# Patient Record
Sex: Female | Born: 1940 | Race: White | Hispanic: No | Marital: Married | Smoking: Never smoker
Health system: Southern US, Community
[De-identification: ages and names within clinical notes are randomized; demographics above are authoritative.]

## PROBLEM LIST (undated history)

## (undated) DIAGNOSIS — N390 Urinary tract infection, site not specified: Secondary | ICD-10-CM

## (undated) DIAGNOSIS — K219 Gastro-esophageal reflux disease without esophagitis: Secondary | ICD-10-CM

## (undated) DIAGNOSIS — R0600 Dyspnea, unspecified: Secondary | ICD-10-CM

## (undated) DIAGNOSIS — N189 Chronic kidney disease, unspecified: Secondary | ICD-10-CM

## (undated) DIAGNOSIS — G4730 Sleep apnea, unspecified: Secondary | ICD-10-CM

## (undated) DIAGNOSIS — E7800 Pure hypercholesterolemia, unspecified: Secondary | ICD-10-CM

## (undated) DIAGNOSIS — J449 Chronic obstructive pulmonary disease, unspecified: Secondary | ICD-10-CM

## (undated) DIAGNOSIS — E039 Hypothyroidism, unspecified: Secondary | ICD-10-CM

## (undated) HISTORY — PX: EYE SURGERY: SHX253

## (undated) HISTORY — PX: HERNIA REPAIR: SHX51

## (undated) HISTORY — PX: OTHER SURGICAL HISTORY: SHX169

## (undated) HISTORY — PX: COLONOSCOPY: SHX174

## (undated) HISTORY — PX: SHOULDER SURGERY: SHX246

## (undated) HISTORY — PX: BLADDER SUSPENSION: SHX72

## (undated) HISTORY — PX: HEMORROIDECTOMY: SUR656

## (undated) HISTORY — PX: ABDOMINAL HYSTERECTOMY: SHX81

---

## 2005-03-23 ENCOUNTER — Ambulatory Visit: Payer: Self-pay

## 2006-03-27 ENCOUNTER — Ambulatory Visit: Payer: Self-pay | Admitting: Nurse Practitioner

## 2007-03-21 ENCOUNTER — Ambulatory Visit: Payer: Self-pay | Admitting: Ophthalmology

## 2007-05-31 ENCOUNTER — Ambulatory Visit: Payer: Self-pay | Admitting: Gastroenterology

## 2007-06-13 ENCOUNTER — Ambulatory Visit: Payer: Self-pay | Admitting: Nurse Practitioner

## 2008-06-16 ENCOUNTER — Ambulatory Visit: Payer: Self-pay | Admitting: Nurse Practitioner

## 2008-08-05 ENCOUNTER — Ambulatory Visit: Payer: Self-pay

## 2008-09-09 ENCOUNTER — Ambulatory Visit: Payer: Self-pay | Admitting: Orthopedic Surgery

## 2008-09-18 ENCOUNTER — Ambulatory Visit: Payer: Self-pay | Admitting: Orthopedic Surgery

## 2008-10-29 ENCOUNTER — Encounter: Payer: Self-pay | Admitting: Specialist

## 2008-11-13 ENCOUNTER — Encounter: Payer: Self-pay | Admitting: Specialist

## 2010-12-08 ENCOUNTER — Ambulatory Visit: Payer: Self-pay | Admitting: Nurse Practitioner

## 2012-01-03 ENCOUNTER — Ambulatory Visit: Payer: Self-pay | Admitting: Nurse Practitioner

## 2012-07-25 ENCOUNTER — Ambulatory Visit: Payer: Self-pay | Admitting: Gastroenterology

## 2013-06-05 ENCOUNTER — Ambulatory Visit: Payer: Self-pay

## 2013-06-14 ENCOUNTER — Ambulatory Visit: Payer: Self-pay | Admitting: Ophthalmology

## 2013-06-24 ENCOUNTER — Ambulatory Visit: Payer: Self-pay | Admitting: Ophthalmology

## 2014-06-06 ENCOUNTER — Ambulatory Visit: Payer: Self-pay | Admitting: Physician Assistant

## 2014-12-05 NOTE — Op Note (Signed)
PATIENT NAME:  Rebecca Snow, DATE MR#:  [id number] DATE OF BIRTH:  May 10, 1941  DATE OF PROCEDURE:  06/24/2013  PREOPERATIVE DIAGNOSIS: Cataract, left eye.   POSTOPERATIVE DIAGNOSIS: Cataract, left eye.   PROCEDURE PERFORMED: Extracapsular cataract extraction using phacoemulsification with placement of Alcon [id number], 25.5-diopter posterior chamber lens, serial number [id number].   SURGEON: Loura Back. Nakya Weyand, M.D.   ANESTHESIA: 4% lidocaine and 0.75% Marcaine a 50-50 mixture with 10 units/mL of HyoMax added, given as a peribulbar.   ANESTHESIOLOGIST: Dr. Myra Gianotti  COMPLICATIONS: None.   ESTIMATED BLOOD LOSS: Less than 1 mL.   DESCRIPTION OF PROCEDURE: The patient was brought to the operating room and given a peribulbar block.  The patient was then prepped and draped in the usual fashion.  The vertical rectus muscles were imbricated using 5-0 silk sutures.  These sutures were then clamped to the sterile drapes as bridle sutures.  A limbal peritomy was performed extending two clock hours and hemostasis was obtained with cautery.  A partial thickness scleral groove was made at the surgical limbus and dissected anteriorly in a lamellar dissection using an Alcon crescent knife.  The anterior chamber was entered supero-temporally with a Superblade and through the lamellar dissection with a 2.6 mm keratome.  DisCoVisc was used to replace the aqueous and a continuous tear capsulorrhexis was carried out.  Hydrodissection and hydrodelineation were carried out with balanced salt and a 27 gauge canula.  The nucleus was rotated to confirm the effectiveness of the hydrodissection.  Phacoemulsification was carried out using a divide-and-conquer technique.  Total ultrasound time was 1 minute and 15.3 seconds with an average power of  28.8%. CDE 28.28.  Irrigation/aspiration was used to remove the residual cortex.  DisCoVisc was used to inflate the capsule and the internal incision was enlarged to 3 mm with the  crescent knife.  The intraocular lens was folded and inserted into the capsular bag using the AcrySert delivery system.  Irrigation/aspiration was used to remove the residual DisCoVisc.  Miostat was injected into the anterior chamber through the paracentesis track to inflate the anterior chamber and induce miosis.  A tenth of a mL of cefuroxime containing 1 mg of drug was injected through the paracentesis track. The wound was checked for leaks and wound leakage was found.  A single 10-0 suture was placed across the incision, tied and the knot was rotated superiorly.  The conjunctiva was closed with cautery and the bridle sutures were removed.  Two drops of 0.3% Vigamox were placed on the eye.   An eye shield was placed on the eye.  The patient was discharged to the recovery room in good condition.  ____________________________ Loura Back Mily Malecki, MD sad:aw D: 06/24/2013 13:01:14 ET T: 06/24/2013 13:35:16 ET JOB#: [id number]  cc: Remo Lipps A. Chun Sellen, MD, <Dictator> Martie Lee MD ELECTRONICALLY SIGNED 07/01/2013 13:51

## 2015-09-14 ENCOUNTER — Other Ambulatory Visit: Payer: Self-pay | Admitting: Physician Assistant

## 2015-09-14 DIAGNOSIS — Z1231 Encounter for screening mammogram for malignant neoplasm of breast: Secondary | ICD-10-CM

## 2015-09-21 ENCOUNTER — Ambulatory Visit: Payer: Self-pay | Attending: Physician Assistant

## 2015-10-13 ENCOUNTER — Other Ambulatory Visit: Payer: Self-pay | Admitting: Physician Assistant

## 2015-10-13 ENCOUNTER — Ambulatory Visit
Admission: RE | Admit: 2015-10-13 | Discharge: 2015-10-13 | Disposition: A | Discharge status: Home / Self Care | Payer: Medicare HMO | Source: Ambulatory Visit | Attending: Physician Assistant | Admitting: Physician Assistant

## 2015-10-13 DIAGNOSIS — Z1231 Encounter for screening mammogram for malignant neoplasm of breast: Secondary | ICD-10-CM | POA: Insufficient documentation

## 2016-12-15 ENCOUNTER — Other Ambulatory Visit: Payer: Self-pay | Admitting: Physician Assistant

## 2016-12-15 DIAGNOSIS — Z1231 Encounter for screening mammogram for malignant neoplasm of breast: Secondary | ICD-10-CM

## 2017-01-03 ENCOUNTER — Ambulatory Visit
Admission: RE | Admit: 2017-01-03 | Discharge: 2017-01-03 | Disposition: A | Discharge status: Home / Self Care | Payer: Medicare HMO | Source: Ambulatory Visit | Attending: Physician Assistant | Admitting: Physician Assistant

## 2017-01-03 DIAGNOSIS — Z1231 Encounter for screening mammogram for malignant neoplasm of breast: Secondary | ICD-10-CM | POA: Diagnosis present

## 2017-06-09 ENCOUNTER — Encounter: Payer: Self-pay | Admitting: *Deleted

## 2017-06-12 ENCOUNTER — Encounter
Admission: RE | Disposition: A | Discharge status: Home / Self Care | Payer: Self-pay | Source: Ambulatory Visit | Attending: Gastroenterology

## 2017-06-12 ENCOUNTER — Ambulatory Visit: Payer: Medicare HMO | Admitting: Anesthesiology

## 2017-06-12 ENCOUNTER — Ambulatory Visit
Admission: RE | Admit: 2017-06-12 | Discharge: 2017-06-12 | Disposition: A | Discharge status: Home / Self Care | Payer: Medicare HMO | Source: Ambulatory Visit | Attending: Gastroenterology | Admitting: Gastroenterology

## 2017-06-12 ENCOUNTER — Encounter: Payer: Self-pay | Admitting: *Deleted

## 2017-06-12 DIAGNOSIS — E7800 Pure hypercholesterolemia, unspecified: Secondary | ICD-10-CM | POA: Diagnosis not present

## 2017-06-12 DIAGNOSIS — K5730 Diverticulosis of large intestine without perforation or abscess without bleeding: Secondary | ICD-10-CM | POA: Insufficient documentation

## 2017-06-12 DIAGNOSIS — Z800 Family history of malignant neoplasm of digestive organs: Secondary | ICD-10-CM | POA: Diagnosis present

## 2017-06-12 DIAGNOSIS — Z8371 Family history of colonic polyps: Secondary | ICD-10-CM | POA: Insufficient documentation

## 2017-06-12 DIAGNOSIS — G4730 Sleep apnea, unspecified: Secondary | ICD-10-CM | POA: Insufficient documentation

## 2017-06-12 DIAGNOSIS — Z79899 Other long term (current) drug therapy: Secondary | ICD-10-CM | POA: Insufficient documentation

## 2017-06-12 DIAGNOSIS — J449 Chronic obstructive pulmonary disease, unspecified: Secondary | ICD-10-CM | POA: Insufficient documentation

## 2017-06-12 DIAGNOSIS — Z7982 Long term (current) use of aspirin: Secondary | ICD-10-CM | POA: Diagnosis not present

## 2017-06-12 DIAGNOSIS — E039 Hypothyroidism, unspecified: Secondary | ICD-10-CM | POA: Insufficient documentation

## 2017-06-12 DIAGNOSIS — K219 Gastro-esophageal reflux disease without esophagitis: Secondary | ICD-10-CM | POA: Insufficient documentation

## 2017-06-12 HISTORY — DX: Gastro-esophageal reflux disease without esophagitis: K21.9

## 2017-06-12 HISTORY — DX: Hypothyroidism, unspecified: E03.9

## 2017-06-12 HISTORY — DX: Chronic obstructive pulmonary disease, unspecified: J44.9

## 2017-06-12 HISTORY — PX: COLONOSCOPY WITH PROPOFOL: SHX5780

## 2017-06-12 HISTORY — DX: Dyspnea, unspecified: R06.00

## 2017-06-12 HISTORY — DX: Sleep apnea, unspecified: G47.30

## 2017-06-12 HISTORY — DX: Pure hypercholesterolemia, unspecified: E78.00

## 2017-06-12 SURGERY — COLONOSCOPY WITH PROPOFOL
Anesthesia: General

## 2017-06-12 MED ORDER — PROPOFOL 10 MG/ML IV BOLUS
INTRAVENOUS | Status: DC | PRN
  Administered 2017-06-12: 80 mg via INTRAVENOUS

## 2017-06-12 MED ORDER — PROPOFOL 500 MG/50ML IV EMUL
INTRAVENOUS | Status: DC | PRN
  Administered 2017-06-12: 100 ug/kg/min via INTRAVENOUS

## 2017-06-12 MED ORDER — SODIUM CHLORIDE 0.9 % IV SOLN: INTRAVENOUS | Status: DC

## 2017-06-12 MED ORDER — PROPOFOL 10 MG/ML IV BOLUS
INTRAVENOUS | Status: AC
  Filled 2017-06-12: qty 20

## 2017-06-12 MED ORDER — SODIUM CHLORIDE 0.9 % IV SOLN
INTRAVENOUS | Status: DC
  Administered 2017-06-12: 1000 mL via INTRAVENOUS

## 2017-06-12 NOTE — Transfer of Care (Signed)
Immediate Anesthesia Transfer of Care Note  Patient: Rebecca Snow  Procedure(s) Performed: COLONOSCOPY WITH PROPOFOL (N/A )  Patient Location: PACU and Endoscopy Unit  Anesthesia Type:General  Level of Consciousness: drowsy and patient cooperative  Airway & Oxygen Therapy: Patient Spontanous Breathing and Patient connected to nasal cannula oxygen  Post-op Assessment: Report given to RN and Post -op Vital signs reviewed and stable  Post vital signs: Reviewed and stable  Last Vitals:  Vitals:   06/12/17 1335 06/12/17 1520  BP: (!) 141/91 (!) 141/91  Pulse: 71 68  Resp: 18 18  Temp: (!) 36.2 C (!) 35.9 C  SpO2: 98% 98%    Last Pain:  Vitals:   06/12/17 1520  TempSrc: Tympanic      Patients Stated Pain Goal: 0 (16/24/46 9507)  Complications: No apparent anesthesia complications

## 2017-06-12 NOTE — Op Note (Signed)
[facility] Gastroenterology Patient Name: Rebecca Snow Procedure Date: 06/12/2017 3:03 PM MRN: [id number] Account #: [id number] Date of Birth: 05/03/1941 Admit Type: Outpatient Age: 76 Room: [facility] ENDO ROOM 3 Gender: Female Note Status: Finalized Procedure:            Colonoscopy Indications:          Family history of colon cancer in a first-degree                        relative, Family history of colonic polyps in a                        first-degree relative Providers:            [name], MD Referring MD:         Joyice Faster (Referring MD) Medicines:            Monitored Anesthesia Care Complications:        No immediate complications. Procedure:            Pre-Anesthesia Assessment:                       - ASA Grade Assessment: II - A patient with mild                        systemic disease.                       After obtaining informed consent, the colonoscope was                        passed under direct vision. Throughout the procedure,                        the patient's blood pressure, pulse, and oxygen                        saturations were monitored continuously. The                        Colonoscope was introduced through the anus with the                        intention of advancing to the cecum. The scope was                        advanced to the sigmoid colon before the procedure was                        aborted. Medications were given. The colonoscopy was                        extremely difficult due to poor bowel prep with stool                        present. Findings:      Multiple medium-mouthed diverticula were found in the distal sigmoid       colon.      A large amount of semi-solid solid stool was found in the sigmoid colon,       precluding visualization.  The digital rectal exam was normal. Impression:           - Diverticulosis in the distal sigmoid colon.                       - Stool in the  sigmoid colon.                       - No specimens collected. Recommendation:       - Discharge patient to home.                       - reschedule and reprep. Procedure Code(s):    --- Professional ---                       [phone], 26, Colonoscopy, flexible; diagnostic, including                        collection of specimen(s) by brushing or washing, when                        performed (separate procedure) Diagnosis Code(s):    --- Professional ---                       Z80.0, Family history of malignant neoplasm of                        digestive organs                       Z83.71, Family history of colonic polyps                       K57.30, Diverticulosis of large intestine without                        perforation or abscess without bleeding CPT copyright 2016 American [employer] Association. All rights reserved. The codes documented in this report are preliminary and upon coder review may  be revised to meet current compliance requirements. [name], MD 06/12/2017 3:24:52 PM This report has been signed electronically. Number of Addenda: 0 Note Initiated On: 06/12/2017 3:03 PM Total Procedure Duration: 0 hours 2 minutes 26 seconds       [facility]

## 2017-06-12 NOTE — H&P (Signed)
Outpatient short stay form Pre-procedure 06/12/2017 2:59 PM Rebecca Sails MD  Primary Physician: Neysa Hotter, PA  Reason for visit:  Colonoscopy  History of present illness:  Patient is a 76 year old female presenting today as above. States she has not had polyps in the past however she has multiple primary relatives that do have colon polyps. There is also family history of colon cancer in her father. He is presenting today for further evaluation. She tolerated her prep however did state that it did make her sick to her stomach. She did get the whole prep down. She is not having a solid stool today.. She does take a daily aspirin but has held that since last week. She takes no blood thinning agents otherwise.    Current Facility-Administered Medications:  .  0.9 %  sodium chloride infusion, , Intravenous, Continuous, Rebecca Sails, MD, Last Rate: 20 mL/hr at 06/12/17 1353, 1,000 mL at 06/12/17 1353 .  0.9 %  sodium chloride infusion, , Intravenous, Continuous, Rebecca Sails, MD  Prescriptions Prior to Admission  Medication Sig Dispense Refill Last Dose  . alendronate (FOSAMAX) 70 MG tablet Take 70 mg by mouth once a week. Take with a full glass of water on an empty stomach.   Past Week at Unknown time  . aspirin EC 81 MG tablet Take 81 mg by mouth daily.   Past Week at Unknown time  . Calcium-Vitamin D-Vitamin K (VIACTIV PO) Take 1 tablet by mouth daily.   Past Week at Unknown time  . cetirizine (ZYRTEC) 10 MG tablet Take 10 mg by mouth daily.   Past Week at Unknown time  . clotrimazole (GYNE-LOTRIMIN) 1 % vaginal cream Place 1 Applicatorful vaginally as needed.   Past Week at Unknown time  . Garlic 8099 MG CAPS Take 1 capsule by mouth daily.   Past Week at Unknown time  . levothyroxine (SYNTHROID, LEVOTHROID) 25 MCG tablet Take 25 mcg by mouth daily before breakfast.   Past Week at Unknown time  . Lysine 600 MG TABS Take 1 tablet by mouth daily.   Past Week at Unknown time   . omega-3 acid ethyl esters (LOVAZA) 1 g capsule Take 1 capsule by mouth daily.   Past Week at Unknown time  . omeprazole (PRILOSEC) 20 MG capsule Take 20 mg by mouth daily.   Past Week at Unknown time  . simvastatin (ZOCOR) 40 MG tablet Take 40 mg by mouth daily.   Past Week at Unknown time     Allergies  Allergen Reactions  . Ibuprofen   . Nyquil Multi-Symptom [Pseudoeph-Doxylamine-Dm-Apap]      Past Medical History:  Diagnosis Date  . COPD (chronic obstructive pulmonary disease) ([place])   . Dyspnea   . GERD (gastroesophageal reflux disease)   . High cholesterol   . Hypothyroidism   . Sleep apnea     Review of systems:      Physical Exam    Heart and lungs: Regular rate and rhythm without rub or gallop, lungs are bilaterally clear.    HEENT: Normocephalic atraumatic eyes are anicteric    Other:     Pertinant exam for procedure: Soft nontender nondistended bowel sounds positive normoactive.    Planned proceedures: Colonoscopy and indicated procedures. I have discussed the risks benefits and complications of procedures to include not limited to bleeding, infection, perforation and the risk of sedation and the patient wishes to proceed.    Rebecca Sails, MD Gastroenterology 06/12/2017  2:59 PM

## 2017-06-12 NOTE — Anesthesia Preprocedure Evaluation (Signed)
Anesthesia Evaluation  Patient identified by MRN, date of birth, ID band Patient awake    Reviewed: Allergy & Precautions, NPO status , Patient's Chart, lab work & pertinent test results  Airway Mallampati: II       Dental  (+) Upper Dentures, Lower Dentures   Pulmonary shortness of breath, sleep apnea ,    breath sounds clear to auscultation       Cardiovascular Exercise Tolerance: Good  Rhythm:Regular     Neuro/Psych negative neurological ROS  negative psych ROS   GI/Hepatic Neg liver ROS, GERD  ,  Endo/Other  Hypothyroidism   Renal/GU negative Renal ROS     Musculoskeletal   Abdominal Normal abdominal exam  (+)   Peds negative pediatric ROS (+)  Hematology negative hematology ROS (+)   Anesthesia Other Findings   Reproductive/Obstetrics                             Anesthesia Physical Anesthesia Plan  ASA: II  Anesthesia Plan: General   Post-op Pain Management:    Induction: Intravenous  PONV Risk Score and Plan: 0  Airway Management Planned: Natural Airway and Nasal Cannula  Additional Equipment:   Intra-op Plan:   Post-operative Plan:   Informed Consent: I have reviewed the patients History and Physical, chart, labs and discussed the procedure including the risks, benefits and alternatives for the proposed anesthesia with the patient or authorized representative who has indicated his/her understanding and acceptance.     Plan Discussed with: CRNA  Anesthesia Plan Comments:         Anesthesia Quick Evaluation

## 2017-06-12 NOTE — Anesthesia Post-op Follow-up Note (Signed)
Anesthesia QCDR form completed.        

## 2017-06-12 NOTE — OR Nursing (Signed)
Procedure aborted. Patient not cleared out. Very poor prep, solid stool observed.

## 2017-06-13 NOTE — Anesthesia Postprocedure Evaluation (Signed)
Anesthesia Post Note  Patient: Rebecca Snow  Procedure(s) Performed: COLONOSCOPY WITH PROPOFOL (N/A )  Patient location during evaluation: PACU Anesthesia Type: General Level of consciousness: awake Pain management: pain level controlled Vital Signs Assessment: post-procedure vital signs reviewed and stable Respiratory status: spontaneous breathing Cardiovascular status: stable Anesthetic complications: no     Last Vitals:  Vitals:   06/12/17 1520 06/12/17 1540  BP: (!) 141/91   Pulse: 68   Resp: 18 (!) 120  Temp: (!) 35.9 C   SpO2: 98%     Last Pain:  Vitals:   06/12/17 1520  TempSrc: Tympanic                 VAN Snow,Rebecca Creps

## 2017-06-14 ENCOUNTER — Encounter: Payer: Self-pay | Admitting: Gastroenterology

## 2017-07-31 ENCOUNTER — Ambulatory Visit
Admission: RE | Admit: 2017-07-31 | Discharge: 2017-07-31 | Disposition: A | Discharge status: Home / Self Care | Payer: Medicare HMO | Source: Ambulatory Visit | Attending: Gastroenterology | Admitting: Gastroenterology

## 2017-07-31 ENCOUNTER — Ambulatory Visit: Payer: Medicare HMO | Admitting: Anesthesiology

## 2017-07-31 ENCOUNTER — Encounter
Admission: RE | Disposition: A | Discharge status: Home / Self Care | Payer: Self-pay | Source: Ambulatory Visit | Attending: Gastroenterology

## 2017-07-31 DIAGNOSIS — Z79899 Other long term (current) drug therapy: Secondary | ICD-10-CM | POA: Insufficient documentation

## 2017-07-31 DIAGNOSIS — E7800 Pure hypercholesterolemia, unspecified: Secondary | ICD-10-CM | POA: Insufficient documentation

## 2017-07-31 DIAGNOSIS — K5730 Diverticulosis of large intestine without perforation or abscess without bleeding: Secondary | ICD-10-CM | POA: Insufficient documentation

## 2017-07-31 DIAGNOSIS — N189 Chronic kidney disease, unspecified: Secondary | ICD-10-CM | POA: Insufficient documentation

## 2017-07-31 DIAGNOSIS — Z888 Allergy status to other drugs, medicaments and biological substances status: Secondary | ICD-10-CM | POA: Diagnosis not present

## 2017-07-31 DIAGNOSIS — K635 Polyp of colon: Secondary | ICD-10-CM | POA: Diagnosis not present

## 2017-07-31 DIAGNOSIS — Z8371 Family history of colonic polyps: Secondary | ICD-10-CM | POA: Diagnosis present

## 2017-07-31 DIAGNOSIS — E039 Hypothyroidism, unspecified: Secondary | ICD-10-CM | POA: Diagnosis not present

## 2017-07-31 DIAGNOSIS — J449 Chronic obstructive pulmonary disease, unspecified: Secondary | ICD-10-CM | POA: Diagnosis not present

## 2017-07-31 DIAGNOSIS — D124 Benign neoplasm of descending colon: Secondary | ICD-10-CM | POA: Diagnosis not present

## 2017-07-31 DIAGNOSIS — Z7982 Long term (current) use of aspirin: Secondary | ICD-10-CM | POA: Insufficient documentation

## 2017-07-31 DIAGNOSIS — G4730 Sleep apnea, unspecified: Secondary | ICD-10-CM | POA: Insufficient documentation

## 2017-07-31 HISTORY — PX: COLONOSCOPY WITH PROPOFOL: SHX5780

## 2017-07-31 HISTORY — DX: Chronic kidney disease, unspecified: N18.9

## 2017-07-31 HISTORY — DX: Urinary tract infection, site not specified: N39.0

## 2017-07-31 SURGERY — COLONOSCOPY WITH PROPOFOL
Anesthesia: General

## 2017-07-31 MED ORDER — PROPOFOL 10 MG/ML IV BOLUS
INTRAVENOUS | Status: DC | PRN
  Administered 2017-07-31: 40 mg via INTRAVENOUS

## 2017-07-31 MED ORDER — GLYCOPYRROLATE 0.2 MG/ML IJ SOLN
INTRAMUSCULAR | Status: DC | PRN
  Administered 2017-07-31: 0.2 mg via INTRAVENOUS

## 2017-07-31 MED ORDER — SODIUM CHLORIDE 0.9 % IV SOLN: INTRAVENOUS | Status: DC

## 2017-07-31 MED ORDER — PHENYLEPHRINE HCL 10 MG/ML IJ SOLN
INTRAMUSCULAR | Status: DC | PRN
  Administered 2017-07-31 (×2): 100 ug via INTRAVENOUS

## 2017-07-31 MED ORDER — PROPOFOL 500 MG/50ML IV EMUL
INTRAVENOUS | Status: DC | PRN
  Administered 2017-07-31: 150 ug/kg/min via INTRAVENOUS

## 2017-07-31 MED ORDER — SODIUM CHLORIDE 0.9 % IV SOLN
INTRAVENOUS | Status: DC
  Administered 2017-07-31: 10:00:00 via INTRAVENOUS

## 2017-07-31 MED ORDER — GLYCOPYRROLATE 0.2 MG/ML IJ SOLN
INTRAMUSCULAR | Status: AC
  Filled 2017-07-31: qty 1

## 2017-07-31 NOTE — Op Note (Signed)
[facility] Gastroenterology Patient Name: Rebecca Snow Procedure Date: 07/31/2017 10:17 AM MRN: [id number] Account #: [id number] Date of Birth: 1941-01-02 Admit Type: Outpatient Age: 76 Room: [facility] ENDO ROOM 1 Gender: Female Note Status: Finalized Procedure:            Colonoscopy Indications:          Family history of colonic polyps in a first-degree                        relative Providers:            [name], MD Referring MD:         Carole Civil MD, MD (Referring MD) Medicines:            Monitored Anesthesia Care Complications:        No immediate complications. Procedure:            Pre-Anesthesia Assessment:                       - ASA Grade Assessment: III - A patient with severe                        systemic disease.                       After obtaining informed consent, the colonoscope was                        passed under direct vision. Throughout the procedure,                        the patient's blood pressure, pulse, and oxygen                        saturations were monitored continuously. The                        Colonoscope was introduced through the anus and                        advanced to the the cecum, identified by appendiceal                        orifice and ileocecal valve. The colonoscopy was                        unusually difficult due to multiple diverticula in the                        colon, significant looping and a tortuous colon.                        Successful completion of the procedure was aided by                        changing the patient to a supine position, changing the                        patient to a prone position and using manual pressure.  The patient tolerated the procedure well. The quality                        of the bowel preparation was good. Findings:      Two sessile polyps were found in the descending colon. The polyps were 3       to 4 mm in  size. These polyps were removed with a cold snare. Resection       and retrieval were complete.      A 2 mm polyp was found in the descending colon. The polyp was sessile.       The polyp was removed with a cold biopsy forceps. Resection and       retrieval were complete.      A 2 mm polyp was found in the descending colon. The polyp was sessile.       The polyp was removed with a cold biopsy forceps. Resection and       retrieval were complete.      Many small and large-mouthed diverticula were found in the sigmoid       colon, descending colon and transverse colon.      The digital rectal exam was normal. Impression:           - Two 3 to 4 mm polyps in the descending colon, removed                        with a cold snare. Resected and retrieved.                       - One 2 mm polyp in the descending colon, removed with                        a cold biopsy forceps. Resected and retrieved.                       - One 2 mm polyp in the descending colon, removed with                        a cold biopsy forceps. Resected and retrieved.                       - Diverticulosis in the sigmoid colon, in the                        descending colon and in the transverse colon. Recommendation:       - Discharge patient to home.                       - Soft diet for 1 day, then advance as tolerated to                        advance diet as tolerated. Procedure Code(s):    --- Professional ---                       [phone], Colonoscopy, flexible; with removal of tumor(s),                        polyp(s), or other lesion(s) by snare technique  [id number], 39, Colonoscopy, flexible; with biopsy, single                        or multiple Diagnosis Code(s):    --- Professional ---                       D12.4, Benign neoplasm of descending colon                       Z83.71, Family history of colonic polyps                       K57.30, Diverticulosis of large intestine without                         perforation or abscess without bleeding CPT copyright 2016 American Medical Association. All rights reserved. The codes documented in this report are preliminary and upon coder review may  be revised to meet current compliance requirements. [name], MD 07/31/2017 11:26:17 AM This report has been signed electronically. Number of Addenda: 0 Note Initiated On: 07/31/2017 10:17 AM Scope Withdrawal Time: 0 hours 11 minutes 14 seconds  Total Procedure Duration: 0 hours 41 minutes 19 seconds       [facility]

## 2017-07-31 NOTE — H&P (Signed)
Outpatient short stay form Pre-procedure 07/31/2017 10:35 AM [name] MD  Primary Physician: Neysa Hotter, PA  Reason for visit:  Colonoscopy  History of present illness:  Patient is a 76 year old female presenting today as above. There is family history of colon polyps and primary relatives. Her last colonoscopy was about 5 years ago. There is an attempt month or so ago that was poor due to prep. She tolerated her prep last night better. She takes no aspirin or blood thinning agent.    Current Facility-Administered Medications:  .  0.9 %  sodium chloride infusion, , Intravenous, Continuous, [name], MD, Last Rate: 20 mL/hr at 07/31/17 1001 .  0.9 %  sodium chloride infusion, , Intravenous, Continuous, [name], MD  Medications Prior to Admission  Medication Sig Dispense Refill Last Dose  . alendronate (FOSAMAX) 70 MG tablet Take 70 mg by mouth once a week. Take with a full glass of water on an empty stomach.   Past Week at Unknown time  . aspirin EC 81 MG tablet Take 81 mg by mouth daily.   07/26/2017  . Calcium-Vitamin D-Vitamin K (VIACTIV PO) Take 1 tablet by mouth daily.   07/26/2017  . cetirizine (ZYRTEC) 10 MG tablet Take 10 mg by mouth daily.   07/26/2017  . clotrimazole (GYNE-LOTRIMIN) 1 % vaginal cream Place 1 Applicatorful vaginally as needed.   Not Taking at Unknown time  . Garlic 4562 MG CAPS Take 1 capsule by mouth daily.   07/26/2017  . levothyroxine (SYNTHROID, LEVOTHROID) 25 MCG tablet Take 25 mcg by mouth daily before breakfast.   07/26/2017  . Lysine 600 MG TABS Take 1 tablet by mouth daily.   07/26/2017  . omega-3 acid ethyl esters (LOVAZA) 1 g capsule Take 1 capsule by mouth daily.   07/26/2017  . omeprazole (PRILOSEC) 20 MG capsule Take 20 mg by mouth daily.   07/29/2017  . simvastatin (ZOCOR) 40 MG tablet Take 40 mg by mouth daily.   07/25/2017     Allergies  Allergen Reactions  . Ibuprofen   . Nyquil Multi-Symptom  [Pseudoeph-Doxylamine-Dm-Apap]      Past Medical History:  Diagnosis Date  . Chronic kidney disease   . COPD (chronic obstructive pulmonary disease) ([place])   . Dyspnea   . GERD (gastroesophageal reflux disease)   . High cholesterol   . Hypothyroidism   . Sleep apnea   . UTI (urinary tract infection)     Review of systems:      Physical Exam    Heart and lungs: Regular rate and rhythm without rub or gallop, lungs are bilaterally clear.    HEENT: Normocephalic atraumatic eyes are anicteric    Other:     Pertinant exam for procedure: Soft nontender nondistended bowel sounds positive normoactive.    Planned proceedures: Colonoscopy and indicated procedures. I have discussed the risks benefits and complications of procedures to include not limited to bleeding, infection, perforation and the risk of sedation and the patient wishes to proceed.    [name], MD Gastroenterology 07/31/2017  10:35 AM

## 2017-07-31 NOTE — Anesthesia Preprocedure Evaluation (Signed)
Anesthesia Evaluation  Patient identified by MRN, date of birth, ID band Patient awake    Reviewed: Allergy & Precautions, H&P , NPO status , Patient's Chart, lab work & pertinent test results, reviewed documented beta blocker date and time   Airway Mallampati: II   Neck ROM: full    Dental  (+) Upper Dentures, Lower Dentures   Pulmonary neg pulmonary ROS, shortness of breath and with exertion, sleep apnea and Continuous Positive Airway Pressure Ventilation , COPD,    Pulmonary exam normal        Cardiovascular Exercise Tolerance: Poor negative cardio ROS Normal cardiovascular exam Rhythm:regular Rate:Normal     Neuro/Psych negative neurological ROS  negative psych ROS   GI/Hepatic negative GI ROS, Neg liver ROS, GERD  Medicated,  Endo/Other  negative endocrine ROSHypothyroidism   Renal/GU negative Renal ROS  negative genitourinary   Musculoskeletal   Abdominal   Peds  Hematology negative hematology ROS (+)   Anesthesia Other Findings Past Medical History: No date: COPD (chronic obstructive pulmonary disease) ([facility]) No date: Dyspnea No date: GERD (gastroesophageal reflux disease) No date: High cholesterol No date: Hypothyroidism No date: Sleep apnea Past Surgical History: No date: ABDOMINAL HYSTERECTOMY No date: BLADDER SUSPENSION No date: COLONOSCOPY 06/12/2017: COLONOSCOPY WITH PROPOFOL; N/A     Comment:  Procedure: COLONOSCOPY WITH PROPOFOL;  Surgeon:               [name], MD;  Location: [facility] ENDOSCOPY;                Service: Endoscopy;  Laterality: N/A; No date: EYE SURGERY No date: HEMORROIDECTOMY     Comment:  internal No date: HERNIA REPAIR No date: leg vein stripping     Comment:  both legs No date: SHOULDER SURGERY; Bilateral BMI    Body Mass Index:  22.31 kg/m     Reproductive/Obstetrics negative OB ROS                             Anesthesia  Physical Anesthesia Plan  ASA: III  Anesthesia Plan: General   Post-op Pain Management:    Induction:   PONV Risk Score and Plan:   Airway Management Planned:   Additional Equipment:   Intra-op Plan:   Post-operative Plan:   Informed Consent: I have reviewed the patients History and Physical, chart, labs and discussed the procedure including the risks, benefits and alternatives for the proposed anesthesia with the patient or authorized representative who has indicated his/her understanding and acceptance.   Dental Advisory Given  Plan Discussed with: CRNA  Anesthesia Plan Comments:         Anesthesia Quick Evaluation

## 2017-07-31 NOTE — Anesthesia Postprocedure Evaluation (Signed)
Anesthesia Post Note  Patient: Rebecca Snow  Procedure(s) Performed: COLONOSCOPY WITH PROPOFOL (N/A )  Patient location during evaluation: PACU Anesthesia Type: General Level of consciousness: awake and alert Pain management: pain level controlled Vital Signs Assessment: post-procedure vital signs reviewed and stable Respiratory status: spontaneous breathing, nonlabored ventilation, respiratory function stable and patient connected to nasal cannula oxygen Cardiovascular status: blood pressure returned to baseline and stable Postop Assessment: no apparent nausea or vomiting Anesthetic complications: no     Last Vitals:  Vitals:   07/31/17 1149 07/31/17 1155  BP: (!) 141/83   Pulse: 89 92  Resp: 20 18  Temp:    SpO2: 100% 100%    Last Pain:  Vitals:   07/31/17 1120  TempSrc: Tympanic                 [name]

## 2017-07-31 NOTE — Transfer of Care (Signed)
Immediate Anesthesia Transfer of Care Note  Patient: Rebecca Snow  Procedure(s) Performed: COLONOSCOPY WITH PROPOFOL (N/A )  Patient Location: PACU  Anesthesia Type:General  Level of Consciousness: sedated  Airway & Oxygen Therapy: Patient Spontanous Breathing and Patient connected to nasal cannula oxygen  Post-op Assessment: Report given to RN and Post -op Vital signs reviewed and stable  Post vital signs: Reviewed and stable  Last Vitals:  Vitals:   07/31/17 0936  BP: (!) 153/73  Pulse: 65  Resp: 20  Temp: (!) 36.3 C  SpO2: 97%    Last Pain:  Vitals:   07/31/17 0936  TempSrc: Tympanic         Complications: No apparent anesthesia complications

## 2017-07-31 NOTE — Anesthesia Post-op Follow-up Note (Signed)
Anesthesia QCDR form completed.        

## 2017-07-31 NOTE — Anesthesia Procedure Notes (Signed)
Date/Time: 07/31/2017 10:41 AM Performed by: Nelda Marseille, CRNA Pre-anesthesia Checklist: Patient identified, Emergency Drugs available, Suction available, Patient being monitored and Timeout performed Oxygen Delivery Method: Nasal cannula

## 2017-08-01 ENCOUNTER — Encounter: Payer: Self-pay | Admitting: Gastroenterology

## 2017-08-03 LAB — SURGICAL PATHOLOGY

## 2018-10-22 ENCOUNTER — Other Ambulatory Visit: Payer: Self-pay | Admitting: Family

## 2018-10-22 DIAGNOSIS — Z1231 Encounter for screening mammogram for malignant neoplasm of breast: Secondary | ICD-10-CM

## 2019-05-08 ENCOUNTER — Other Ambulatory Visit: Payer: Self-pay | Admitting: Internal Medicine

## 2019-05-15 ENCOUNTER — Ambulatory Visit
Admission: RE | Admit: 2019-05-15 | Discharge: 2019-05-15 | Disposition: A | Discharge status: Home / Self Care | Payer: Medicare HMO | Source: Ambulatory Visit | Attending: Family | Admitting: Family

## 2019-05-15 DIAGNOSIS — Z1231 Encounter for screening mammogram for malignant neoplasm of breast: Secondary | ICD-10-CM | POA: Diagnosis not present

## 2020-07-13 IMAGING — MG MM DIGITAL SCREENING BILAT W/ TOMO W/ CAD
8 series · 8 of 24 positions shown · non-contrast
Comparison: Previous exam(s).

CLINICAL DATA: Screening.

EXAM:
DIGITAL SCREENING BILATERAL MAMMOGRAM WITH TOMO AND CAD

[R CC synth-2D]
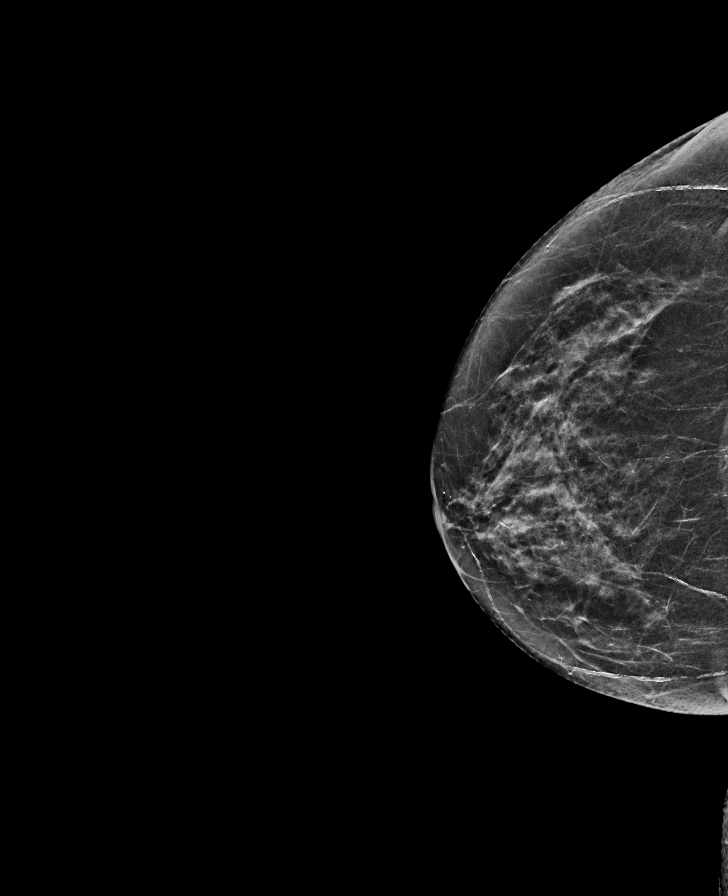

[L MLO synth-2D]
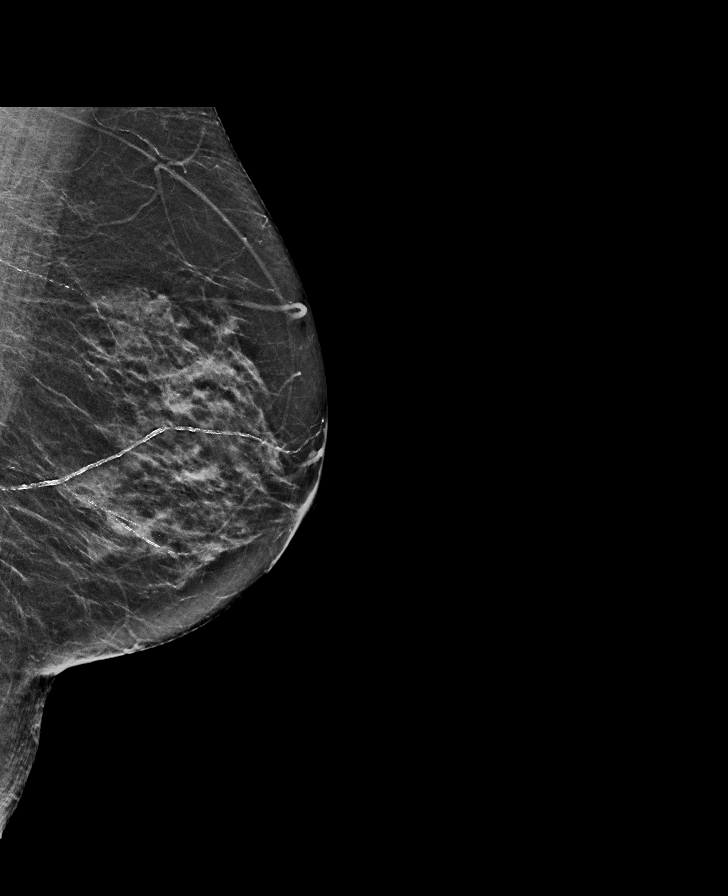

[L CC synth-2D]
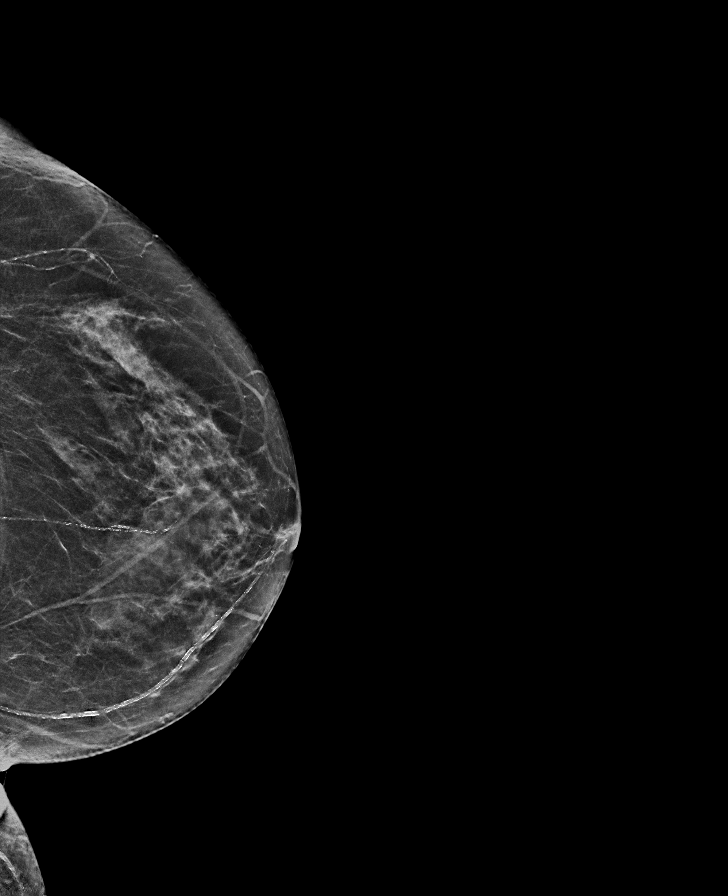

[R MLO synth-2D]
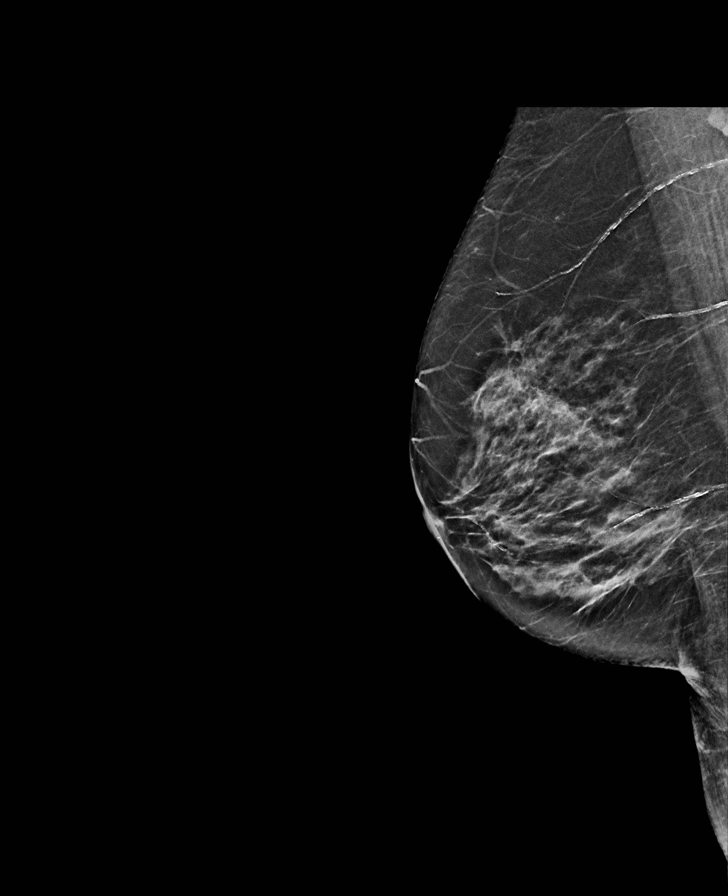

[R MLO tomo · tomo slice 29/58.0]
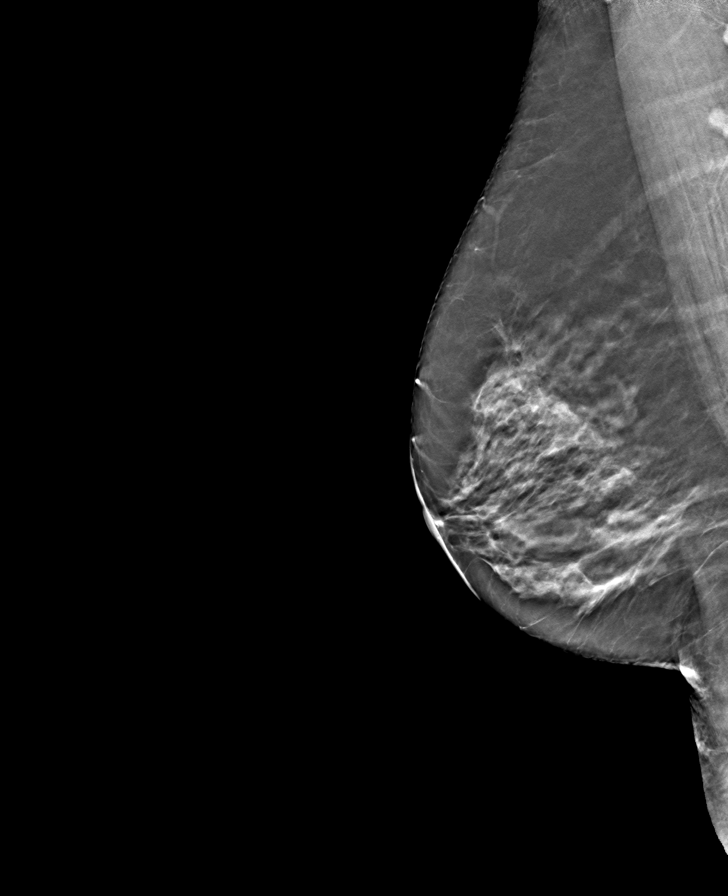

[R CC tomo · tomo slice 31/60.0]
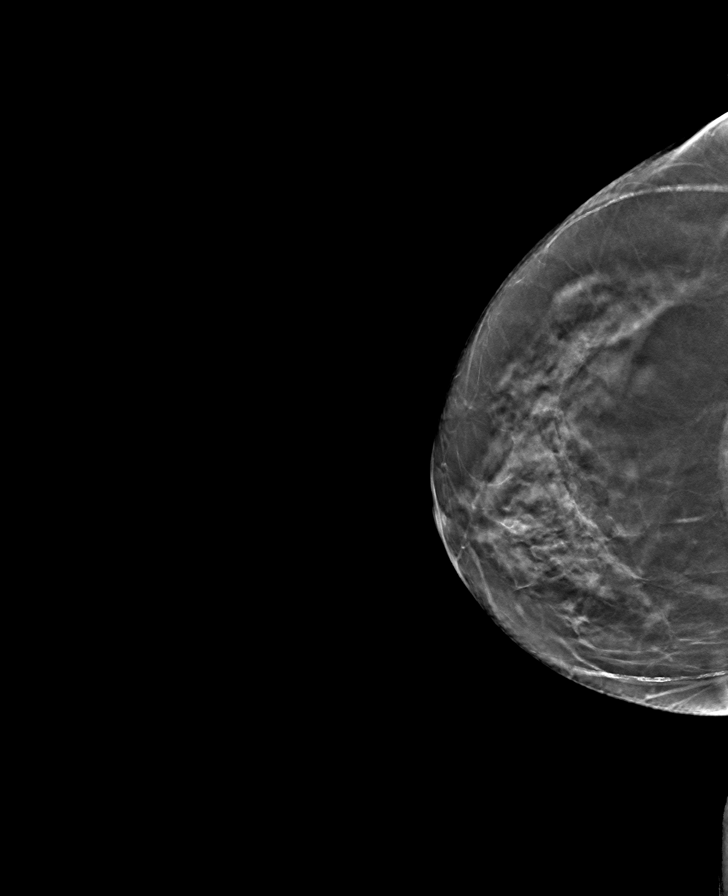

[L MLO tomo · tomo slice 29/57.0]
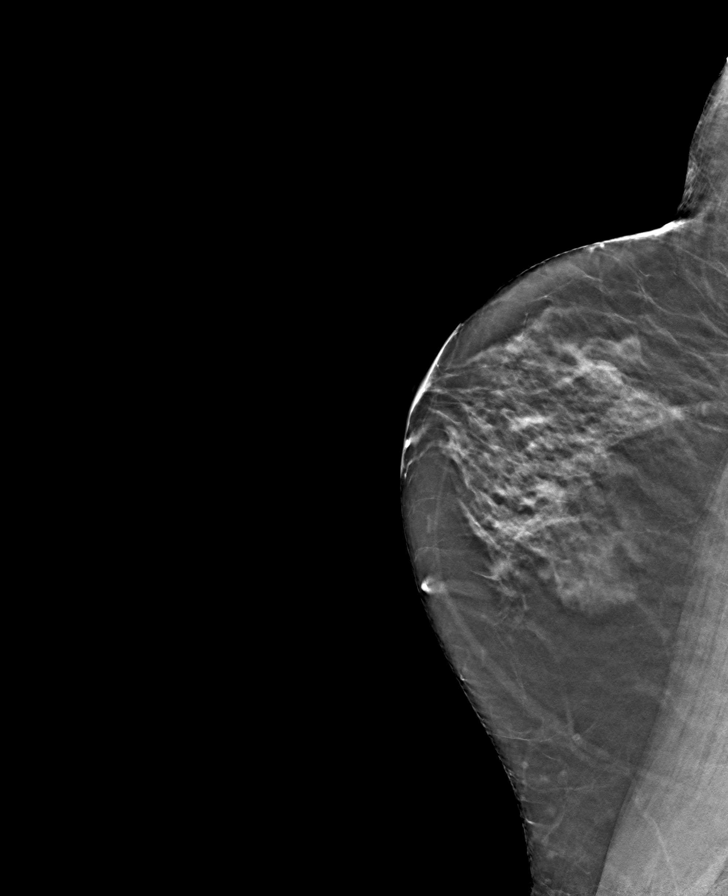

[L CC tomo · tomo slice 29/57.0]
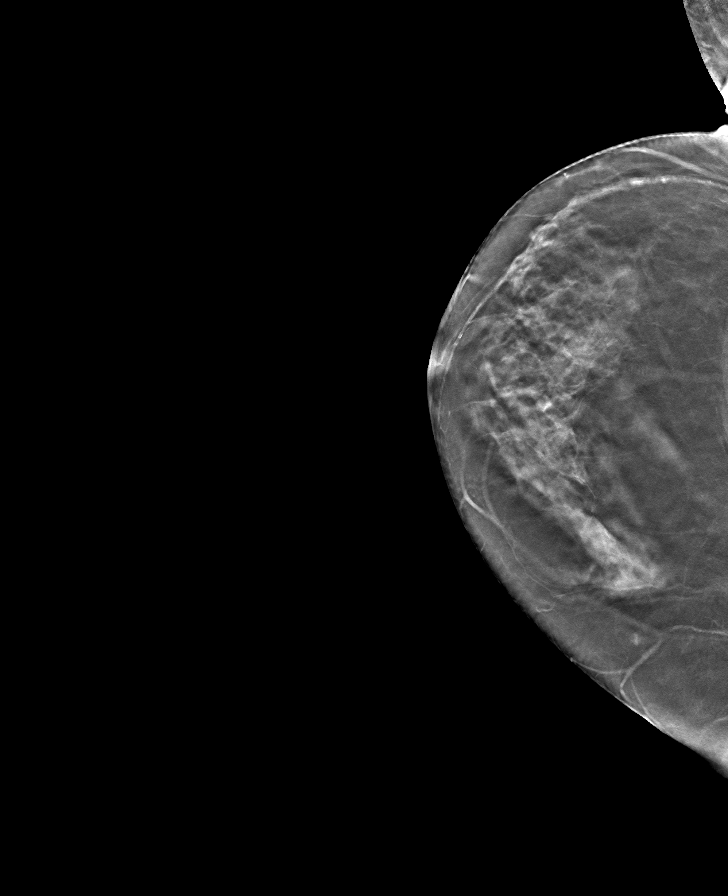

[8 of 24 positions shown; findings below may reference images not displayed]

ACR Breast Density Category c: The breast tissue is heterogeneously
dense, which may obscure small masses.
FINDINGS: There are no findings suspicious for malignancy. Images were
processed with CAD.
IMPRESSION: No mammographic evidence of malignancy. A result letter of this
screening mammogram will be mailed directly to the patient.

RECOMMENDATION:
Screening mammogram in one year. (Code:[id number]-[id number])

BI-RADS CATEGORY  1: Negative.

## 2021-02-22 ENCOUNTER — Other Ambulatory Visit: Payer: Self-pay | Admitting: Family Medicine

## 2021-03-11 ENCOUNTER — Encounter: Payer: Self-pay | Admitting: Urology

## 2021-03-11 ENCOUNTER — Other Ambulatory Visit: Payer: Self-pay

## 2021-03-11 ENCOUNTER — Ambulatory Visit: Payer: Medicare HMO | Admitting: Urology

## 2021-03-11 VITALS — BP 153/75 | HR 73 | Ht 62.0 in | Wt 130.0 lb

## 2021-03-11 DIAGNOSIS — N390 Urinary tract infection, site not specified: Secondary | ICD-10-CM

## 2021-03-11 LAB — BLADDER SCAN AMB NON-IMAGING: Scan Result: 0

## 2021-03-11 NOTE — Patient Instructions (Signed)
Take over the counter cranberry and D-Mannose tablets daily for UTI prevention

## 2021-03-11 NOTE — Progress Notes (Signed)
03/11/2021 9:58 AM   Rebecca Snow December 01, 1940 [id number]  Referring provider: Joyice Faster, FNP [address] [place],  [place] [address]  Chief Complaint  Patient presents with   Recurrent UTI    HPI: Rebecca Snow is an 80 y.o. female referred for evaluation of recurrent UTIs.  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 Denies history febrile UTIs/pyelonephritis Denies gross hematuria   PMH: Past Medical History:  Diagnosis Date   Chronic kidney disease    COPD (chronic obstructive pulmonary disease) (HCC)    Dyspnea    GERD (gastroesophageal reflux disease)    High cholesterol    Hypothyroidism    Sleep apnea    UTI (urinary tract infection)     Surgical History: Past Surgical History:  Procedure Laterality Date   ABDOMINAL HYSTERECTOMY     BLADDER SUSPENSION     COLONOSCOPY     COLONOSCOPY WITH PROPOFOL N/A 06/12/2017   Procedure: COLONOSCOPY WITH PROPOFOL;  Surgeon: [name], MD;  Location: [facility] ENDOSCOPY;  Service: Endoscopy;  Laterality: N/A;   COLONOSCOPY WITH PROPOFOL N/A 07/31/2017   Procedure: COLONOSCOPY WITH PROPOFOL;  Surgeon: [name], MD;  Location: [facility] ENDOSCOPY;  Service: Endoscopy;  Laterality: N/A;   EYE SURGERY     HEMORROIDECTOMY     internal   HERNIA REPAIR     leg vein stripping     both legs   SHOULDER SURGERY Bilateral     Home Medications:  Allergies as of 03/11/2021       Reactions   Ibuprofen    Nyquil Multi-symptom [pseudoeph-doxylamine-dm-apap]         Medication List        Accurate as of March 11, 2021  9:58 AM. If you have any questions, ask your nurse or doctor.          alendronate 70 MG tablet Commonly known  as: FOSAMAX Take 70 mg by mouth once a week. Take with a full glass of water on an empty stomach.   aspirin EC 81 MG tablet Take 81 mg by mouth daily.   cetirizine 10 MG tablet Commonly known as: ZYRTEC Take 10 mg by mouth daily.   clotrimazole 1 % vaginal cream Commonly known as: GYNE-LOTRIMIN Place 1 Applicatorful vaginally as needed.   Garlic 123XX123 MG Caps Take 1 capsule by mouth daily.   levothyroxine 25 MCG tablet Commonly known as: SYNTHROID Take 25 mcg by mouth daily before breakfast.   Lysine 600 MG Tabs Take 1 tablet by mouth daily.   omega-3 acid ethyl esters 1 g capsule Commonly known as: LOVAZA Take 1 capsule by mouth daily.   omeprazole 20 MG capsule Commonly known as: PRILOSEC Take 20 mg by mouth daily.   simvastatin 40 MG tablet Commonly known as: ZOCOR Take 40 mg by mouth daily.   VIACTIV PO Take 1 tablet by mouth daily.        Allergies:  Allergies  Allergen Reactions   Ibuprofen    Nyquil Multi-Symptom [Pseudoeph-Doxylamine-Dm-Apap]     Family History: Family History  Problem Relation Age of Onset   Breast cancer Neg Hx     Social History:  reports that she has never smoked. She has never used smokeless tobacco. She reports that  she does not drink alcohol and does not use drugs.   Physical Exam: BP (!) 153/75   Pulse 73   Ht '5\' 2"'$  (1.575 m)   Wt 130 lb (59 kg)   BMI 23.78 kg/m   Constitutional:  Alert and oriented, No acute distress. HEENT: [place] AT, moist mucus membranes.  Trachea midline, no masses. Cardiovascular: No clubbing, cyanosis, or edema. Respiratory: Normal respiratory effort, no increased work of breathing. Skin: No rashes, bruises or suspicious lesions. Neurologic: Grossly intact, no focal deficits, moving all 4 extremities. Psychiatric: Normal mood and affect.  Laboratory Data:  Urinalysis Dipstick/microscopy negative   Assessment & Plan:    1.  Recurrent UTI Urinalysis today clear Bladder scan PVR 0  mL We discussed recurrent UTIs are quite common in postmenopausal women and can be difficult to prevent She is presently asymptomatic We discussed over-the-counter supplements of cranberry tablets and D-mannose for UTI prevention Follow-up ~ 8 weeks for repeat UA and symptom recheck   Rebecca Snow, [place] [address] [place], [place] [address] [phone]

## 2021-03-12 LAB — MICROSCOPIC EXAMINATION

## 2021-03-12 LAB — URINALYSIS, COMPLETE
Bilirubin, UA: NEGATIVE
Glucose, UA: NEGATIVE
Ketones, UA: NEGATIVE
Leukocytes,UA: NEGATIVE
Nitrite, UA: NEGATIVE
Protein,UA: NEGATIVE
Specific Gravity, UA: >1.03 — ABNORMAL HIGH (ref 1.005–1.030)
Urobilinogen, Ur: 0.2 mg/dL (ref 0.2–1.0)
pH, UA: 5 (ref 5.0–7.5)

## 2021-04-19 ENCOUNTER — Emergency Department
Admission: EM | Admit: 2021-04-19 | Discharge: 2021-04-20 | Disposition: A | Discharge status: Home / Self Care | Payer: Medicare HMO | Attending: Emergency Medicine | Admitting: Emergency Medicine

## 2021-04-19 ENCOUNTER — Other Ambulatory Visit: Payer: Self-pay

## 2021-04-19 DIAGNOSIS — K625 Hemorrhage of anus and rectum: Secondary | ICD-10-CM

## 2021-04-19 DIAGNOSIS — K648 Other hemorrhoids: Secondary | ICD-10-CM | POA: Diagnosis not present

## 2021-04-19 DIAGNOSIS — J449 Chronic obstructive pulmonary disease, unspecified: Secondary | ICD-10-CM | POA: Insufficient documentation

## 2021-04-19 DIAGNOSIS — Z7982 Long term (current) use of aspirin: Secondary | ICD-10-CM | POA: Diagnosis not present

## 2021-04-19 DIAGNOSIS — E039 Hypothyroidism, unspecified: Secondary | ICD-10-CM | POA: Insufficient documentation

## 2021-04-19 DIAGNOSIS — N189 Chronic kidney disease, unspecified: Secondary | ICD-10-CM | POA: Insufficient documentation

## 2021-04-19 LAB — COMPREHENSIVE METABOLIC PANEL
ALT: 15 U/L (ref 0–44)
AST: 22 U/L (ref 15–41)
Albumin: 3.8 g/dL (ref 3.5–5.0)
Alkaline Phosphatase: 80 U/L (ref 38–126)
Anion gap: 5 (ref 5–15)
BUN: 18 mg/dL (ref 8–23)
CO2: 27 mmol/L (ref 22–32)
Calcium: 8.7 mg/dL — ABNORMAL LOW (ref 8.9–10.3)
Chloride: 107 mmol/L (ref 98–111)
Creatinine, Ser: 0.83 mg/dL (ref 0.44–1.00)
GFR, Estimated: >60 mL/min (ref >60)
Glucose, Bld: 158 mg/dL — ABNORMAL HIGH (ref 70–99)
Potassium: 4 mmol/L (ref 3.5–5.1)
Sodium: 139 mmol/L (ref 135–145)
Total Bilirubin: 0.8 mg/dL (ref 0.3–1.2)
Total Protein: 6.8 g/dL (ref 6.5–8.1)

## 2021-04-19 LAB — CBC
HCT: 40.6 % (ref 36.0–46.0)
Hemoglobin: 13.7 g/dL (ref 12.0–15.0)
MCH: 31.9 pg (ref 26.0–34.0)
MCHC: 33.7 g/dL (ref 30.0–36.0)
MCV: 94.6 fL (ref 80.0–100.0)
Platelets: 250 10*3/uL (ref 150–400)
RBC: 4.29 MIL/uL (ref 3.87–5.11)
RDW: 13.2 % (ref 11.5–15.5)
WBC: 10.5 10*3/uL (ref 4.0–10.5)
nRBC: 0 % (ref 0.0–0.2)

## 2021-04-19 LAB — PROTIME-INR
INR: 1 (ref 0.8–1.2)
Prothrombin Time: 12.8 seconds (ref 11.4–15.2)

## 2021-04-19 LAB — APTT: aPTT: 32 seconds (ref 24–36)

## 2021-04-19 NOTE — ED Provider Notes (Addendum)
[facility] Emergency Department Provider Note  ____________________________________________   Event Date/Time   First MD Initiated Contact with Patient 04/19/21 2301     (approximate)  I have reviewed the triage vital signs and the nursing notes.   HISTORY  Chief Complaint Rectal Bleeding    HPI Rebecca Snow is a 80 y.o. female with CKD, history of hemorrhoids who comes in with concerns for rectal bleeding.  Patient reports that 13 days ago she is status post 2 bands for hemorrhoids.  She states that when she had the bands placed she only had a little bit of blood with wiping.  However today around 7 PM she heart started having more bleeding that was continuous, even without bowel movements, moderate, nothing makes it better or worse.  Patient states that she was seen by Dr. Bary Castilla.  She denies any other symptoms associated with it. No blood thinner.      Past Medical History:  Diagnosis Date   Chronic kidney disease    COPD (chronic obstructive pulmonary disease) (HCC)    Dyspnea    GERD (gastroesophageal reflux disease)    High cholesterol    Hypothyroidism    Sleep apnea    UTI (urinary tract infection)     There are no problems to display for this patient.   Past Surgical History:  Procedure Laterality Date   ABDOMINAL HYSTERECTOMY     BLADDER SUSPENSION     COLONOSCOPY     COLONOSCOPY WITH PROPOFOL N/A 06/12/2017   Procedure: COLONOSCOPY WITH PROPOFOL;  Surgeon: [name], MD;  Location: [facility] ENDOSCOPY;  Service: Endoscopy;  Laterality: N/A;   COLONOSCOPY WITH PROPOFOL N/A 07/31/2017   Procedure: COLONOSCOPY WITH PROPOFOL;  Surgeon: [name], MD;  Location: [facility] ENDOSCOPY;  Service: Endoscopy;  Laterality: N/A;   EYE SURGERY     HEMORROIDECTOMY     internal   HERNIA REPAIR     leg vein stripping     both legs   SHOULDER SURGERY Bilateral     Prior to Admission medications   Medication Sig Start Date End  Date Taking? Authorizing Provider  alendronate (FOSAMAX) 70 MG tablet Take 70 mg by mouth once a week. Take with a full glass of water on an empty stomach.    [provider]  aspirin EC 81 MG tablet Take 81 mg by mouth daily.    [provider]  Calcium-Vitamin D-Vitamin K (VIACTIV PO) Take 1 tablet by mouth daily.    [provider]  cetirizine (ZYRTEC) 10 MG tablet Take 10 mg by mouth daily.    [provider]  clotrimazole (GYNE-LOTRIMIN) 1 % vaginal cream Place 1 Applicatorful vaginally as needed.    [provider]  Garlic 123XX123 MG CAPS Take 1 capsule by mouth daily.    [provider]  levothyroxine (SYNTHROID, LEVOTHROID) 25 MCG tablet Take 25 mcg by mouth daily before breakfast.    [provider]  Lysine 600 MG TABS Take 1 tablet by mouth daily.    [provider]  omega-3 acid ethyl esters (LOVAZA) 1 g capsule Take 1 capsule by mouth daily.    [provider]  omeprazole (PRILOSEC) 20 MG capsule Take 20 mg by mouth daily.    [provider]  simvastatin (ZOCOR) 40 MG tablet Take 40 mg by mouth daily.    [provider]    Allergies Ibuprofen and Nyquil multi-symptom [pseudoeph-doxylamine-dm-apap]  Family History  Problem Relation Age  of Onset   Breast cancer Neg Hx     Social History Social History   Tobacco Use   Smoking status: Never   Smokeless tobacco: Never  Vaping Use   Vaping Use: Never used  Substance Use Topics   Alcohol use: No   Drug use: No      Review of Systems Constitutional: No fever/chills Eyes: No visual changes. ENT: No sore throat. Cardiovascular: Denies chest pain. Respiratory: Denies shortness of breath. Gastrointestinal: No abdominal pain.  No nausea, no vomiting.  No diarrhea.  No constipation.  Rectal bleeding Genitourinary: Negative for dysuria. Musculoskeletal: Negative for back pain. Skin: Negative for rash. Neurological: Negative for  headaches, focal weakness or numbness.  All other ROS negative ____________________________________________   PHYSICAL EXAM:  VITAL SIGNS: ED Triage Vitals [04/19/21 2243]  Enc Vitals Group     BP      Pulse      Resp      Temp      Temp src      SpO2      Weight      Height '5\' 2"'$  (1.575 m)     Head Circumference      Peak Flow      Pain Score 0     Pain Loc      Pain Edu?      Excl. in [place]?     Constitutional: Alert and oriented. Well appearing and in no acute distress. Eyes: Conjunctivae are normal. EOMI. Head: Atraumatic. Nose: No congestion/rhinnorhea. Mouth/Throat: Mucous membranes are moist.   Neck: No stridor. Trachea Midline. FROM Cardiovascular: Normal rate, regular rhythm. Grossly normal heart sounds.  Good peripheral circulation. Respiratory: Normal respiratory effort.  No retractions. Lungs CTAB. Gastrointestinal: Soft and nontender. No distention. No abdominal bruits.  Musculoskeletal: No lower extremity tenderness nor edema.  No joint effusions. Neurologic:  Normal speech and language. No gross focal neurologic deficits are appreciated.  Skin:  Skin is warm, dry and intact. No rash noted. Psychiatric: Mood and affect are normal. Speech and behavior are normal. GU: Internal hemorrhoids palpated.  Right red blood on examination, small amount in pad   ____________________________________________   LABS (all labs ordered are listed, but only abnormal results are displayed)  Labs Reviewed  CBC  COMPREHENSIVE METABOLIC PANEL  POC OCCULT BLOOD, ED  TYPE AND SCREEN   ____________________________________________   ED ECG REPORT I, Vanessa Destrehan, the attending physician, personally viewed and interpreted this ECG.  Normal sinus rate of 93, no ST elevation, no T wave inversions, normal intervals ____________________________________________ \ PROCEDURES  Procedure(s) performed (including Critical Care):  .1-3 Lead EKG Interpretation  Date/Time:  04/20/2021 12:13 AM Performed by: Vanessa Frio, MD Authorized by: Vanessa Harrison, MD     Interpretation: normal     ECG rate:  90s   ECG rate assessment: normal     Rhythm: sinus rhythm     Ectopy: none     Conduction: normal     ____________________________________________   INITIAL IMPRESSION / ASSESSMENT AND PLAN / ED COURSE  Rebecca Snow was evaluated in Emergency Department on 04/19/2021 for the symptoms described in the history of present illness. She was evaluated in the context of the global COVID-19 pandemic, which necessitated consideration that the patient might be at risk for infection with the SARS-CoV-2 virus that causes COVID-19. Institutional protocols and algorithms that pertain to the evaluation of patients at risk for COVID-19 are in a state of rapid change based  on information released by regulatory bodies including the CDC and federal and state organizations. These policies and algorithms were followed during the patient's care in the ED.    Patient comes in with bright red blood per rectum.  Suspect this is most likely secondary to hemorrhoids given internal hemorrhoid palpated on exam and recent hemorrhoid banding.  Her abdomen is soft and nontender and low suspicion for mesenteric ischemia.  Also low suspicion for upper GI bleed given no melena on examination.  We will get labs to evaluate for coagulopathy although patient is on any blood thinners as well as any evidence of anemia.  We will keep patient on the cardiac monitor.  On a cardiac monitor when I go into the room she occasionally goes up to a little sinus tachycardia in the low 100s.  Patient states that she just feels anxious about being here and when I walk out of the room her heart rate goes back down into the 90s.  Bleeding has been going on for over 4 hours and her hemoglobin is stable.  Her vitals are also stable.  I discussed the case with Dr. Bary Castilla her surgeon and they are going to be able to see her in  the office tomorrow.  I did discuss with patient that even though her hemoglobin is stable now I cannot predict the future.  We discussed admission to the hospital to observe and trend her hemoglobins versus going home.  Patient is adamant that she would like to go home which I think is reasonable given her hemoglobin is stable, vitals are stable and she has close follow-up tomorrow.  Patient's been ambulatory without any symptoms of lightheadedness or dizziness.  However patient understands that if she develops any symptoms with the bleeding gets worse she needs to return to the ER immediately for recheck of her hemoglobin.  At this time however she would like to be able to go home.  I discussed the provisional nature of ED diagnosis, the treatment so far, the ongoing plan of care, follow up appointments and return precautions with the patient and any family or support people present. They expressed understanding and agreed with the plan, discharged home.         ____________________________________________   FINAL CLINICAL IMPRESSION(S) / ED DIAGNOSES   Final diagnoses:  Rectal bleeding  Internal hemorrhoids      MEDICATIONS GIVEN DURING THIS VISIT:  Medications - No data to display   ED Discharge Orders     None        Note:  This document was prepared using Dragon voice recognition software and may include unintentional dictation errors.    Vanessa [place], MD 04/20/21 [id number]    Vanessa Turrell, MD 04/20/21 0015    Vanessa [place], MD 04/20/21 [phone]

## 2021-04-19 NOTE — ED Triage Notes (Signed)
Pt presents via POV c/o rectal bleeding. Pt recently had hemorrhoid banded. Pt states blood is bright red in color. Pt oriented and alert x4.

## 2021-04-19 NOTE — ED Notes (Signed)
ED Provider at bedside. 

## 2021-04-19 NOTE — ED Notes (Signed)
Pt's husband to bedside.

## 2021-04-20 ENCOUNTER — Other Ambulatory Visit: Payer: Self-pay

## 2021-04-20 LAB — TYPE AND SCREEN
ABO/RH(D): O POS
Antibody Screen: NEGATIVE

## 2021-04-20 NOTE — Discharge Instructions (Addendum)
Call Dr. Dwyane Luo office tomorrow to get a follow-up tomorrow.  I talked to him on the phone and he states they should be able to see you tomorrow.  In the meantime if you develop worsening bleeding, lightheadedness, dizziness or any other concerns you need to return to the ER immediately for repeat evaluation.  I cannot predict your hemoglobin level will go low and you may need to have a repeat hemoglobin.

## 2021-04-20 NOTE — ED Notes (Signed)
Two EKGs uploaded to chart in error at 02:26:46, and 02:26:08. These are in error and not for [facility].

## 2021-04-26 NOTE — Progress Notes (Signed)
04/27/2021 1:49 PM   Rebecca Snow [birth year] [id number]  Referring provider: Joyice Faster, FNP [address] [place],  [place] [address]  Urological history: 1. rUTI's -risk factors of age and vaginal atrophy. -documented positive urine cultures over the last year  None -managed with cranberry tablets and d-mannose   Chief Complaint  Patient presents with   Recurrent UTI    HPI: Rebecca Snow is a 80 y.o. female who presents today for a 8 week follow up.   She has been taking the cranberry tablets and drinking cranberry juice.  She has vaginal estrogen cream at home, but she does not use it consistently.    She is currently asymptomatic.  She does not recall the last time she has had an UTI.    Patient denies any modifying or aggravating factors.  Patient denies any gross hematuria, dysuria or suprapubic/flank pain.  Patient denies any fevers, chills, nausea or vomiting.      PMH: Past Medical History:  Diagnosis Date   Chronic kidney disease    COPD (chronic obstructive pulmonary disease) (HCC)    Dyspnea    GERD (gastroesophageal reflux disease)    High cholesterol    Hypothyroidism    Sleep apnea    UTI (urinary tract infection)     Surgical History: Past Surgical History:  Procedure Laterality Date   ABDOMINAL HYSTERECTOMY     BLADDER SUSPENSION     COLONOSCOPY     COLONOSCOPY WITH PROPOFOL N/A 06/12/2017   Procedure: COLONOSCOPY WITH PROPOFOL;  Surgeon: [name], MD;  Location: [facility] ENDOSCOPY;  Service: Endoscopy;  Laterality: N/A;   COLONOSCOPY WITH PROPOFOL N/A 07/31/2017   Procedure: COLONOSCOPY WITH PROPOFOL;  Surgeon: [name], MD;  Location: [facility] ENDOSCOPY;  Service: Endoscopy;  Laterality: N/A;   EYE SURGERY     HEMORROIDECTOMY     internal   HERNIA REPAIR     leg vein stripping     both legs   SHOULDER SURGERY Bilateral     Home Medications:  Allergies as of 04/27/2021       Reactions   Ibuprofen    Nyquil  Multi-symptom [pseudoeph-doxylamine-dm-apap]         Medication List        Accurate as of April 27, 2021 11:59 PM. If you have any questions, ask your nurse or doctor.          STOP taking these medications    alendronate 70 MG tablet Commonly known as: FOSAMAX Stopped by: Zara Council, PA-C       TAKE these medications    aspirin EC 81 MG tablet Take 81 mg by mouth daily.   cetirizine 10 MG tablet Commonly known as: ZYRTEC Take 10 mg by mouth daily.   clotrimazole 1 % vaginal cream Commonly known as: GYNE-LOTRIMIN Place 1 Applicatorful vaginally as needed.   Garlic 6433 MG Caps Take 1 capsule by mouth daily.   levothyroxine 25 MCG tablet Commonly known as: SYNTHROID Take 25 mcg by mouth daily before breakfast.   Lysine 600 MG Tabs Take 1 tablet by mouth daily.   omega-3 acid ethyl esters 1 g capsule Commonly known as: LOVAZA Take 1 capsule by mouth daily.   omeprazole 20 MG capsule Commonly known as: PRILOSEC Take 20 mg by mouth daily.   simvastatin 40 MG tablet Commonly known as: ZOCOR Take 40 mg by mouth daily.   VIACTIV PO Take 1 tablet by mouth daily.  Allergies:  Allergies  Allergen Reactions   Ibuprofen    Nyquil Multi-Symptom [Pseudoeph-Doxylamine-Dm-Apap]     Family History: Family History  Problem Relation Age of Onset   Breast cancer Neg Hx     Social History:  reports that she has never smoked. She has never used smokeless tobacco. She reports that she does not drink alcohol and does not use drugs.  ROS: Pertinent ROS in HPI  Physical Exam: BP 137/82   Pulse 81   Ht _0  (1.575 m)   Wt 125 lb (56.7 kg)   BMI 22.86 kg/m   Constitutional:  Well nourished. Alert and oriented, No acute distress. HEENT: [place] AT, mask in place.  Trachea midline. Cardiovascular: No clubbing, cyanosis, or edema. Respiratory: Normal respiratory effort, no increased work of breathing. Neurologic: Grossly intact, no focal  deficits, moving all 4 extremities. Psychiatric: Normal mood and affect.    Laboratory Data: Lab Results  Component Value Date   WBC 10.5 04/19/2021   HGB 13.7 04/19/2021   HCT 40.6 04/19/2021   MCV 94.6 04/19/2021   PLT 250 04/19/2021    Lab Results  Component Value Date   CREATININE 0.83 04/19/2021   Lab Results  Component Value Date   AST 22 04/19/2021   Lab Results  Component Value Date   ALT 15 04/19/2021     Urinalysis Component     Latest Ref Rng & Units 04/27/2021  Specific Gravity, UA     1.005 - 1.030 >1.030 (H)  pH, UA     5.0 - 7.5 5.5  Color, UA     Yellow Yellow  Appearance Ur     Clear Cloudy (A)  Leukocytes,UA     Negative 2+ (A)  Protein,UA     Negative/Trace Trace (A)  Glucose, UA     Negative Negative  Ketones, UA     Negative Negative  RBC, UA     Negative Trace (A)  Bilirubin, UA     Negative Negative  Urobilinogen, Ur     0.2 - 1.0 mg/dL 0.2  Nitrite, UA     Negative Negative  Microscopic Examination      See below:   Component     Latest Ref Rng & Units 04/27/2021  WBC, UA     0 - 5 /hpf 11-30 (A)  RBC     0 - 2 /hpf 0-2  Epithelial Cells (non renal)     0 - 10 /hpf 0-10  Bacteria, UA     None seen/Few Few  I have reviewed the labs.   Pertinent Imaging: N/A Assessment & Plan:    1. Presumptive rUTI's - criteria for recurrent UTI has been met with 2 or more infections in 6 months or 3 or greater infections in one year  - patient is instructed to increase their water intake until the urine is pale yellow or clear (10 to 12 cups daily)  - patient is instructed to take probiotics (yogurt, oral pills or vaginal suppositories), take cranberry pills or drink the juice and Vitamin C 1,000 mg daily to acidify the urine  - avoid soaking in tubs and wipe front to back after urinating  -she will contact us for symptom flares so that we may check an UA and urine culture  2. Vaginal atrophy -I explained to the patient that when  women go through menopause and her estrogen levels are severely diminished, the normal vaginal flora will change.  This is due to an increase  of the vaginal canal's pH. Because of this, the vaginal canal may be colonized by bacteria from the rectum instead of the protective lactobacillus.  This, accompanied by the loss of the mucus barrier with vaginal atrophy, is a cause of recurrent urinary tract infections. -In some studies, the use of vaginal estrogen cream has been demonstrated to reduce  recurrent urinary tract infections to one a year.  -she will start the vaginal estrogen cream three nights weekly                                                Return if symptoms worsen or fail to improve.  These notes generated with voice recognition software. I apologize for typographical errors.  Zara Council, PA-C  [facility] [address]  [place], [place] [address] [phone]

## 2021-04-27 ENCOUNTER — Encounter: Payer: Self-pay | Admitting: Urology

## 2021-04-27 ENCOUNTER — Ambulatory Visit: Payer: Medicare HMO | Admitting: Urology

## 2021-04-27 ENCOUNTER — Other Ambulatory Visit: Payer: Self-pay

## 2021-04-27 VITALS — BP 137/82 | HR 81 | Ht 62.0 in | Wt 125.0 lb

## 2021-04-27 DIAGNOSIS — N390 Urinary tract infection, site not specified: Secondary | ICD-10-CM

## 2021-04-28 LAB — URINALYSIS, COMPLETE
Bilirubin, UA: NEGATIVE
Glucose, UA: NEGATIVE
Ketones, UA: NEGATIVE
Nitrite, UA: NEGATIVE
Specific Gravity, UA: >1.03 — ABNORMAL HIGH (ref 1.005–1.030)
Urobilinogen, Ur: 0.2 mg/dL (ref 0.2–1.0)
pH, UA: 5.5 (ref 5.0–7.5)

## 2021-04-28 LAB — MICROSCOPIC EXAMINATION

## 2024-03-18 ENCOUNTER — Other Ambulatory Visit (HOSPITAL_COMMUNITY): Payer: Self-pay | Admitting: Internal Medicine

## 2024-03-18 ENCOUNTER — Other Ambulatory Visit: Payer: Self-pay | Admitting: Internal Medicine

## 2024-03-18 DIAGNOSIS — R221 Localized swelling, mass and lump, neck: Secondary | ICD-10-CM

## 2024-03-26 ENCOUNTER — Ambulatory Visit
Admission: RE | Admit: 2024-03-26 | Discharge: 2024-03-26 | Disposition: A | Discharge status: Home / Self Care | Source: Ambulatory Visit | Attending: Internal Medicine | Admitting: Internal Medicine

## 2024-03-26 DIAGNOSIS — R221 Localized swelling, mass and lump, neck: Secondary | ICD-10-CM | POA: Insufficient documentation

## 2024-03-26 LAB — POCT I-STAT CREATININE: Creatinine, Ser: 0.9 mg/dL (ref 0.44–1.00)

## 2024-03-26 MED ORDER — IOHEXOL 300 MG/ML  SOLN
75.0000 mL | Freq: Once | INTRAMUSCULAR | Status: AC | PRN
  Administered 2024-03-26 (×2): 75 mL via INTRAVENOUS
